# Patient Record
Sex: Female | Born: 1935 | Race: White | Hispanic: No
Health system: Southern US, Community
[De-identification: ages and names within clinical notes are randomized; demographics above are authoritative.]

---

## 2002-11-09 ENCOUNTER — Ambulatory Visit (HOSPITAL_COMMUNITY): Admission: RE | Admit: 2002-11-09 | Discharge: 2002-11-09 | Payer: Self-pay | Admitting: Internal Medicine

## 2002-11-28 ENCOUNTER — Ambulatory Visit (HOSPITAL_COMMUNITY): Admission: RE | Admit: 2002-11-28 | Discharge: 2002-11-28 | Payer: Self-pay | Admitting: Pulmonary Disease

## 2003-04-25 ENCOUNTER — Ambulatory Visit (HOSPITAL_COMMUNITY): Admission: RE | Admit: 2003-04-25 | Discharge: 2003-04-25 | Payer: Self-pay | Admitting: Pulmonary Disease

## 2003-09-19 ENCOUNTER — Ambulatory Visit (HOSPITAL_COMMUNITY): Admission: RE | Admit: 2003-09-19 | Discharge: 2003-09-19 | Payer: Self-pay | Admitting: General Surgery

## 2005-11-05 ENCOUNTER — Ambulatory Visit (HOSPITAL_COMMUNITY): Admission: RE | Admit: 2005-11-05 | Discharge: 2005-11-05 | Payer: Self-pay | Admitting: Pulmonary Disease

## 2005-12-03 ENCOUNTER — Ambulatory Visit (HOSPITAL_COMMUNITY): Admission: RE | Admit: 2005-12-03 | Discharge: 2005-12-03 | Payer: Self-pay | Admitting: Ophthalmology

## 2005-12-31 ENCOUNTER — Ambulatory Visit (HOSPITAL_COMMUNITY): Admission: RE | Admit: 2005-12-31 | Discharge: 2005-12-31 | Payer: Self-pay | Admitting: Ophthalmology

## 2007-04-12 ENCOUNTER — Ambulatory Visit (HOSPITAL_COMMUNITY): Admission: RE | Admit: 2007-04-12 | Discharge: 2007-04-12 | Payer: Self-pay | Admitting: Cardiovascular Disease

## 2009-03-11 ENCOUNTER — Ambulatory Visit (HOSPITAL_COMMUNITY): Admission: RE | Admit: 2009-03-11 | Discharge: 2009-03-11 | Payer: Self-pay | Admitting: Internal Medicine

## 2010-05-25 LAB — CBC
Hemoglobin: 11.4 g/dL — ABNORMAL LOW (ref 12.0–15.0)
Platelets: 270 10*3/uL (ref 150–400)
RDW: 14.1 % (ref 11.5–15.5)

## 2010-05-25 LAB — DIFFERENTIAL
Basophils Absolute: 0 10*3/uL (ref 0.0–0.1)
Lymphocytes Relative: 9 % — ABNORMAL LOW (ref 12–46)
Neutro Abs: 8.2 10*3/uL — ABNORMAL HIGH (ref 1.7–7.7)

## 2010-07-22 NOTE — Procedures (Signed)
NAMEEVA, Theresa Obrien                ACCOUNT NO.:  [id number]   MEDICAL RECORD NO.:  [id number]          PATIENT TYPE:  OUT   LOCATION:  RAD                           FACILITY:  [facility]   PHYSICIAN:  Richard A. Alanda Amass, M.D.DATE OF BIRTH:  [birth year]   DATE OF PROCEDURE:  DATE OF DISCHARGE:                                ECHOCARDIOGRAM   HISTORY OF PRESENT ILLNESS:  This 77 year old white woman has a history  of recent onset memory deficit compatible with OBS under the care of Dr.  Felecia Shelling.  Examination reveals a cardiac murmur compatible with aortic  valvular disease.   The aorta is normal at 3.3 cm.   The aortic root demonstrates mild sclerosis but is normal size.   The aortic valve has three leaflets.  There is moderate aortic  sclerosis.  A fairly good valve opening is seen.  There is a 2.6 meters  per second CW jet across the aortic valve corresponding to a peak  instantaneous gradient of 28 mmHg.  Mean aortic valve gradient is not  calculated, however, it is probably in the 10-15 mmHg range.  Estimated  aortic valve area is approximately 1.5 cm2 compatible with mild aortic  valvular stenosis.  There is trace to mild AI seen.   Left left atrium is enlarged at 4.9 cm.  The patient was in sinus rhythm  during the study and there were no clots seen.   IVS and LV PW were concentrically thickened to 1.5 and 1.4 cm each,  compatible with mild-to-moderate concentric left ventricular  hypertrophy.   There is normal septal and posterior wall motion and no segmental wall  motion abnormality seen.   Left ventricular size is normal with LV IDD equal to 3.8 and LV ISD  equal 2.6 cm.  Left ventricular systolic function is normal with EF  greater than 55%.   LV inflow signal shows E to A reversal compatible with diastolic  relaxation abnormality.   Right ventricle appears normal, tricuspid valve appears normal with mild  TR present.   Mitral valve demonstrates mild mitral annular  calcification.  There is  normal valve opening and.  There is no mitral valve prolapse  demonstrated, and there is mild mitral regurgitation.   Pericardium appears normal and there is no pericardial effusion.   Two-D echo demonstrates mild aortic valvular stenosis with an estimated  peak instantaneous gradient of 20 mmHg and an estimated mean valvular  gradient of approximately 5 mmHg compatible with very mild AS.   There is normal systolic function with mild-to-moderate concentric LVH  and diastolic relaxation abnormality.      Richard A. Alanda Amass, M.D.  Electronically Signed     RAW/MEDQ  D:  04/12/2007  T:  04/13/2007  Job:  [id number]

## 2010-07-25 NOTE — Op Note (Signed)
   [name], Theresa Obrien                            ACCOUNT NO.:  [id number]   MEDICAL RECORD NO.:  [id number]                   PATIENT TYPE:  AMB   LOCATION:  DAY                                  FACILITY:  [facility]   PHYSICIAN:  Lionel December, M.D.                 DATE OF BIRTH:  [birth year]   DATE OF PROCEDURE:  11/09/2002  DATE OF DISCHARGE:                                 OPERATIVE REPORT   ADDENDUM:  We requested records from [facility] and just received them.  The  patient was hospitalized in April 1979 with abdominal pain and rectal  bleeding, and she had a normal sigmoidoscopy and a barium enema showing left  colonic diverticulosis.  She was readmitted in January 1981, and she had a  follow-up barium enema which was normal other than a sigmoidoscopy.  Therefore, she has never had polyps although these are suspected.  The  patient therefore will not need another colonoscopy for 10 years.                                               Lionel December, M.D.    NR/MEDQ  D:  11/09/2002  T:  11/09/2002  Job:  [id number]   cc:   Ramon Dredge L. Juanetta Gosling, M.D.  [address]  [place]  [place] [address]  Fax: [phone]   Richard A. Alanda Amass, M.D.  [phone]  [place]  [place] [address]  Fax: [phone]

## 2010-07-25 NOTE — Procedures (Signed)
   Theresa Obrien, Theresa Obrien                            ACCOUNT NO.:  [id number]   MEDICAL RECORD NO.:  [id number]                   PATIENT TYPE:   LOCATION:                                       FACILITY:   PHYSICIAN:  Edward L. Juanetta Gosling, M.D.             DATE OF BIRTH:   DATE OF PROCEDURE:  DATE OF DISCHARGE:                              PULMONARY FUNCTION TEST   IMPRESSION:  1. Spirometry is normal.  2. Lung volumes show minimal restrictive change.  3. DLCO moderately reduced.      ___________________________________________                                            Oneal Deputy. Juanetta Gosling, M.D.   ELH/MEDQ  D:  11/30/2002  T:  12/01/2002  Job:  [id number]   cc:   Gerlene Burdock A. Alanda Amass, M.D.  [phone] [address]  [place]  [place] [address]  Fax: [phone]

## 2010-07-25 NOTE — Op Note (Signed)
Theresa Obrien, Theresa Obrien                ACCOUNT NO.:  [id number]   MEDICAL RECORD NO.:  [id number]          PATIENT TYPE:  AMB   LOCATION:  DAY                           FACILITY:  [facility]   PHYSICIAN:  Susanne Greenhouse, MD       DATE OF BIRTH:  [birth year]   DATE OF PROCEDURE:  12/03/2005  DATE OF DISCHARGE:                                 OPERATIVE REPORT   PREOPERATIVE DIAGNOSIS:  Nuclear and cortical cataract, right eye.   POSTOPERATIVE DIAGNOSIS:  Nuclear and cortical cataract, right eye.   OPERATION PERFORMED:  Phacoemulsification, posterior chamber, interocular  lens implantation, right eye.   SURGEON:  Susanne Greenhouse, MD   ANESTHESIA:  Topical monitored anesthesia care.   OPERATIVE SUMMARY:  In the preoperative holding area, dilating drops and 2%  viscous lidocaine were placed into the right eye.  Patient was brought to  the operating room, where the eye was prepped and draped.  A super-sharp  blade was used to make a paracentesis port at the surgeon's 2 o'clock  position.  The anterior chamber was filled with 1% nonpreserved lidocaine  solution followed by Amvisc Plus.  A 2.85 mm keratome blade was then used to  make a clear corneal incision at the superotemporal limbus.  A cystotome  needle was used to create a continuous __________ capsulotomy.  Hydrodistention was performed using balanced salt solution on a fine  cannula.  The lens nucleus was removed with phacoemulsification using a  quadrant-cracking technique.  Residual cortex was removed with irrigation  and aspiration.  The capsular bag and anterior chamber were refilled with  Amvisc Plus.  A posterior chamber intraocular lens was placed into the  capsular bag using a Monarch lens injecting system.  The Amvisc Plus was  then removed from the capsular bag and anterior chamber with irrigation and  aspiration.  Stromal hydration of the main incision and paracentesis ports  were performed with balanced salt solution on a fine  cannula.  The wound was  tested for leak, which was negative.  No sutures were placed.  There were no  operative complications.  Patient tolerated the procedure well and was  returned to the recovery area in satisfactory condition.   PROSTHETIC DEVICES:  Alcon AcrySof posterior chamber interocular lens, model  SN60WF, power of 23.5, serial #[id number].           ______________________________  Susanne Greenhouse, MD     KEH/MEDQ  D:  12/03/2005  T:  12/04/2005  Job:  [id number]

## 2010-07-25 NOTE — Op Note (Signed)
NAMESARAI, Theresa Obrien                            ACCOUNT NO.:  [id number]   MEDICAL RECORD NO.:  [id number]                   PATIENT TYPE:  AMB   LOCATION:  DAY                                  FACILITY:  [facility]   PHYSICIAN:  Lionel December, M.D.                 DATE OF BIRTH:  September 16, 1931   DATE OF PROCEDURE:  11/09/2002  DATE OF DISCHARGE:                                 OPERATIVE REPORT   PROCEDURE:  Total colonoscopy.   HISTORY:  Theresa Obrien is a 77 year old Caucasian female who was scheduled for a  screening colonoscopy.  However, she tells me that she had a colonoscopy  about 20 years ago when she had a GI bleed and had some polyps removed.  Family history, however, is negative for colorectal carcinoma, and she  presently does not have any GI symptoms.   PROCEDURE IN DETAIL:  The procedure risks were reviewed with the patient and  informed consent was obtained.   PREOPERATIVE MEDICATIONS:  Demerol 45 mg IV, Versed 6 mg IV in divided dose.   FINDINGS:  The procedure was performed in the endoscopy suite.  The  patient's vital signs and O2 sat were monitored during the procedure and  remained stable.  The patient was placed in the left lateral recumbent  position.  Rectal examination was performed.  No abnormality noted on  external or digital exam.  The Olympus video scope was placed in the rectum  and advanced into the sigmoid colon and beyond.  Preparation was  satisfactory.  Multiple diverticula were noted at the sigmoid, a few more at  descending, and two tiny ones at transverse colon.  The scope was advanced  to the cecum, which was identified by the ileocecal valve and appendiceal  orifice.  The valve was quite __________.  Pictures taken for the record.  As the scope was withdrawn, the colonic mucosa as well as skin carefully  examined.  There was a tiny cecal area which was nonbleeding in the left  lumen.  The mucosa and the rest of the colon was normal.  The rectal mucosa  was also normal.  The scope was retroflexed and examined the anorectal  junction.  Small hemorrhoids were noted below the dentate line.  The  endoscope was straightened and withdrawn.  The patient tolerated the  procedure well.   FINAL DIAGNOSES:  1. Examination performed to the cecum.  2. No evidence of colonic polyps.  3. Diverticulosis, predominantly left-sided.  4. Small cecal area.  5. Small external hemorrhoids.   RECOMMENDATIONS:  1. High-fiber diet.  2.     Citrucel 1 tablespoonful daily.  3. We will try to get records of last colonoscopy from [facility].  If indeed she     has had polyps, then she should consider her next exam in five years from     now.  Otherwise, could wait ten.  Lionel December, M.D.    NR/MEDQ  D:  11/09/2002  T:  11/09/2002  Job:  [id number]   cc:   Ramon Dredge L. Juanetta Gosling, M.D.  [address]  [place]  [place] [address]  Fax: [phone]

## 2011-11-18 ENCOUNTER — Encounter (HOSPITAL_COMMUNITY): Payer: Self-pay

## 2011-11-18 ENCOUNTER — Ambulatory Visit (HOSPITAL_COMMUNITY)
Admission: RE | Admit: 2011-11-18 | Discharge: 2011-11-18 | Disposition: A | Discharge status: Home / Self Care | Payer: PRIVATE HEALTH INSURANCE | Source: Ambulatory Visit | Attending: Internal Medicine | Admitting: Internal Medicine

## 2011-11-18 ENCOUNTER — Other Ambulatory Visit (HOSPITAL_COMMUNITY): Payer: Self-pay | Admitting: Internal Medicine

## 2011-11-18 DIAGNOSIS — S1093XA Contusion of unspecified part of neck, initial encounter: Secondary | ICD-10-CM | POA: Insufficient documentation

## 2011-11-18 DIAGNOSIS — G319 Degenerative disease of nervous system, unspecified: Secondary | ICD-10-CM | POA: Insufficient documentation

## 2011-11-18 DIAGNOSIS — W19XXXA Unspecified fall, initial encounter: Secondary | ICD-10-CM

## 2011-11-18 DIAGNOSIS — R51 Headache: Secondary | ICD-10-CM | POA: Insufficient documentation

## 2011-11-18 DIAGNOSIS — S0003XA Contusion of scalp, initial encounter: Secondary | ICD-10-CM | POA: Insufficient documentation

## 2011-11-18 DIAGNOSIS — W1809XA Striking against other object with subsequent fall, initial encounter: Secondary | ICD-10-CM | POA: Insufficient documentation

## 2013-12-28 IMAGING — CT CT HEAD W/O CM
1 of 3 series · 13 of 30 positions shown, 17 images · non-contrast
Comparison: 03/11/2009

CLINICAL DATA: Fall.  forehead hematoma.

CT HEAD WITHOUT CONTRAST
TECHNIQUE: Contiguous axial images were obtained from the base of
the skull through the vertex without contrast.

[Series 4: headtrauma 2.4 h60s · axial · 0.43mm/px · z∈[+116,+243]mm · 13 of 60 slices shown, 17 images]
[im 5/60  brain]
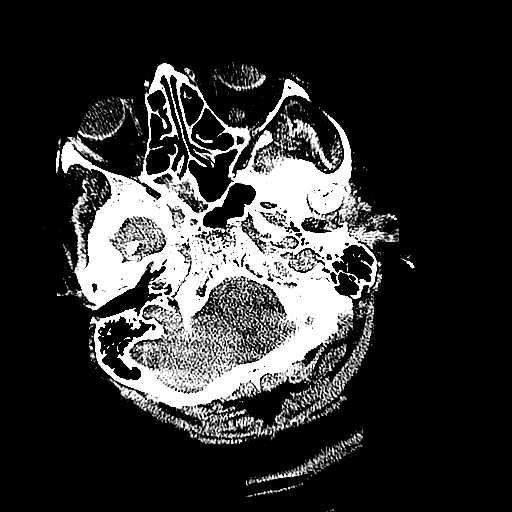
[im 5/60  bone]
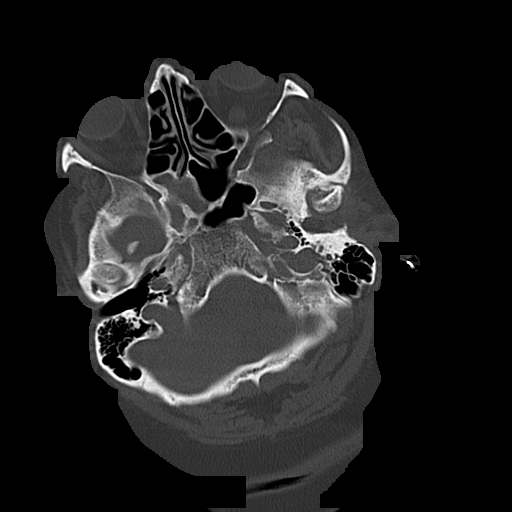
[im 9/60  brain]
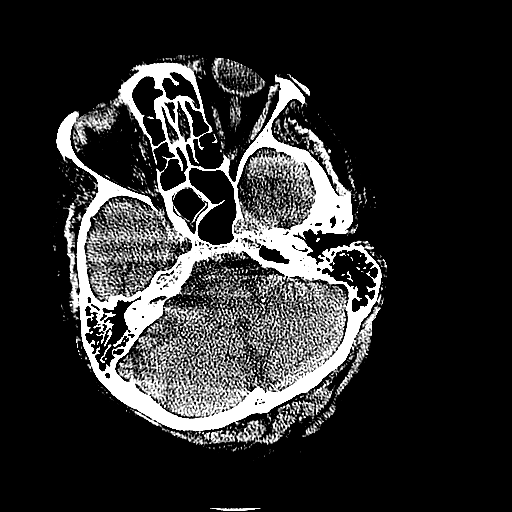
[im 13/60  brain]
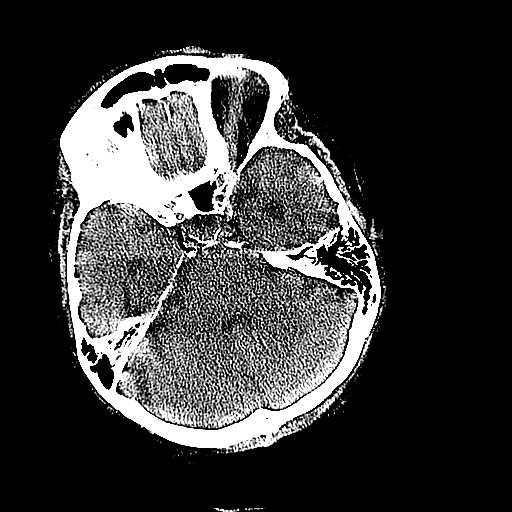
[im 17/60  brain]
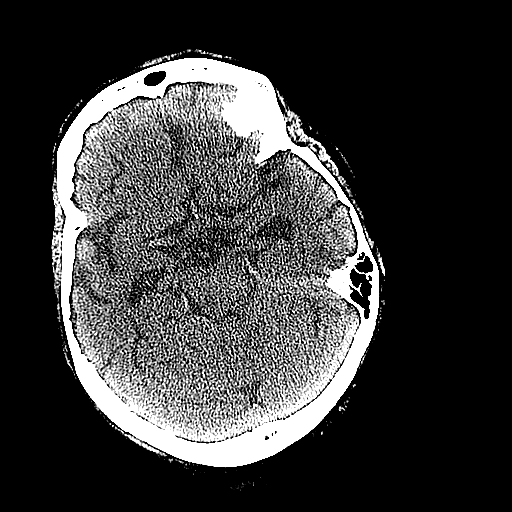
[im 22/60  brain]
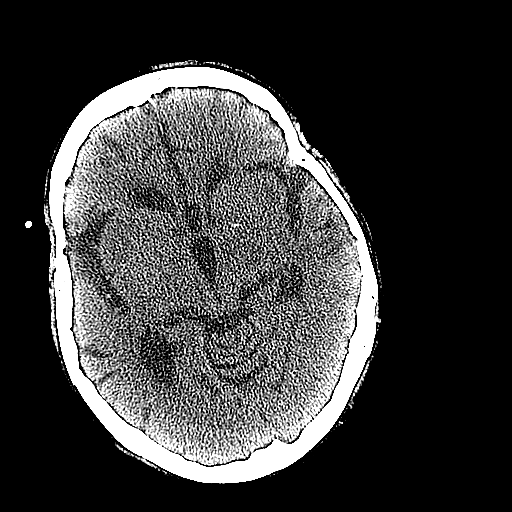
[im 22/60  bone]
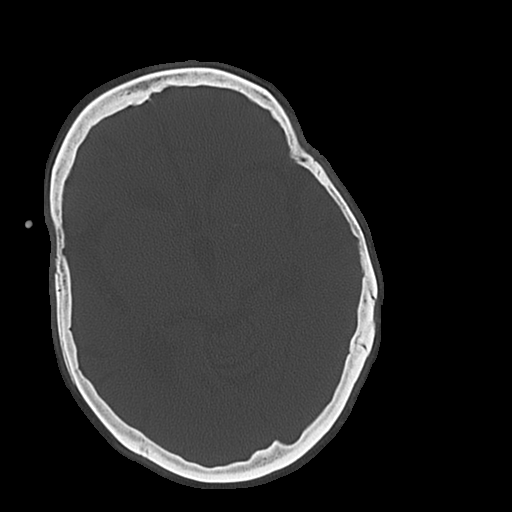
[im 26/60  brain]
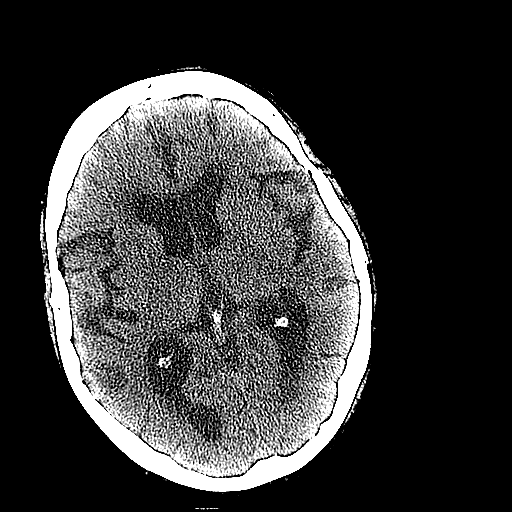
[im 30/60  brain]
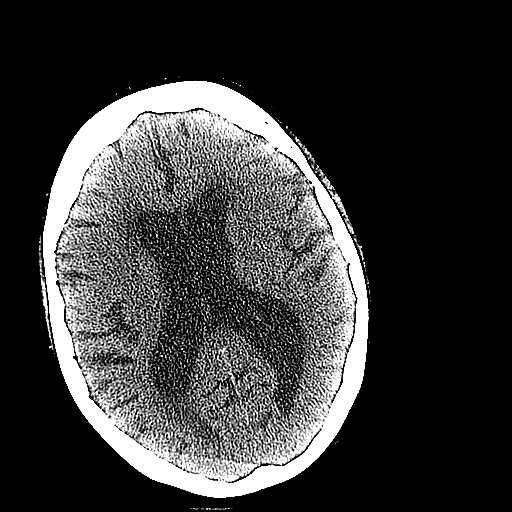
[im 34/60  brain]
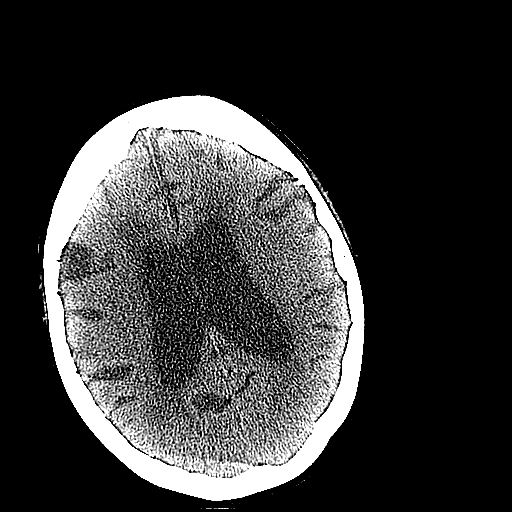
[im 38/60  brain]
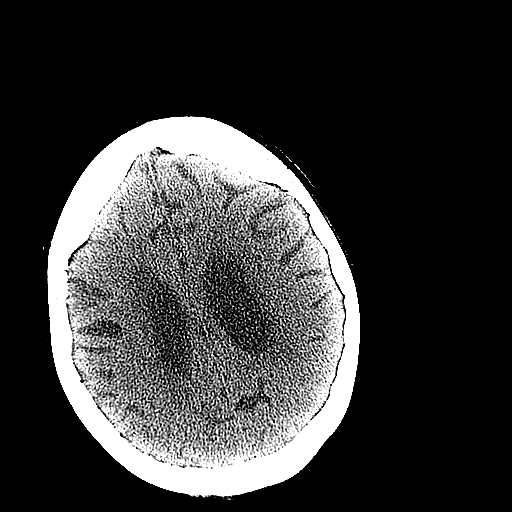
[im 38/60  bone]
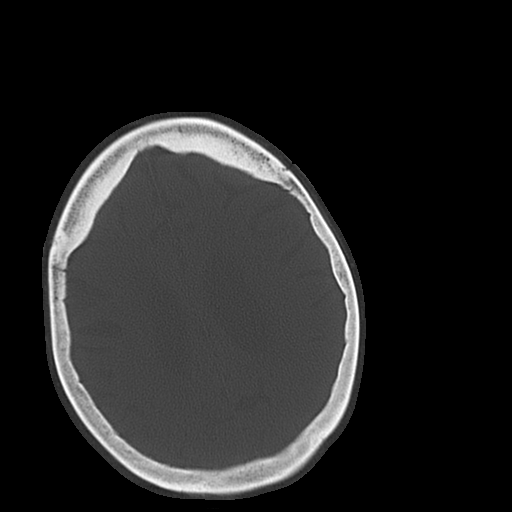
[im 43/60  brain]
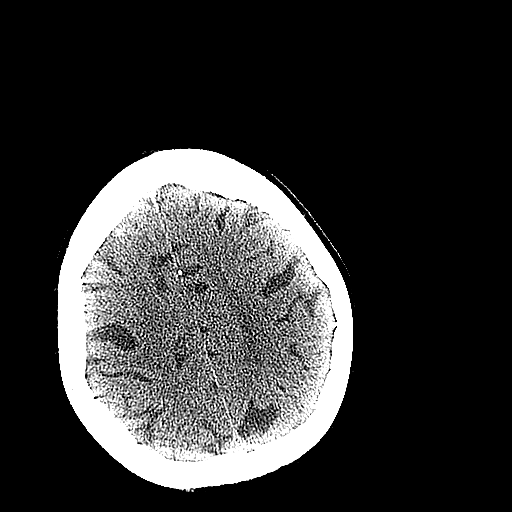
[im 47/60  brain]
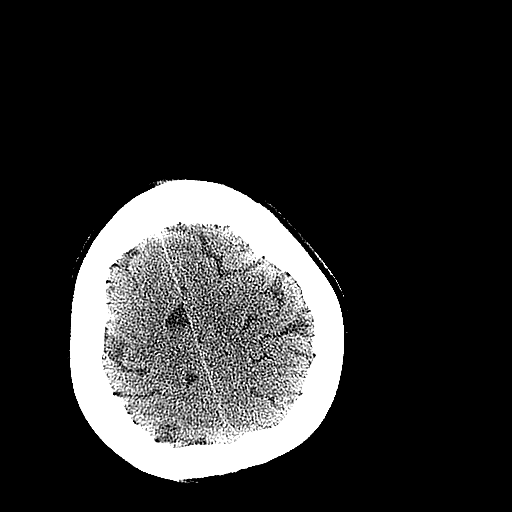
[im 51/60  brain]
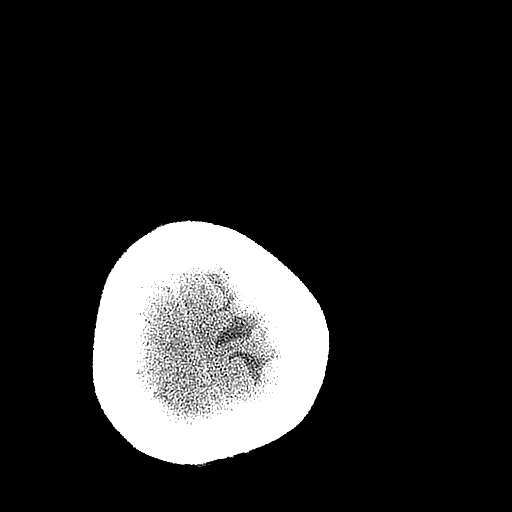
[im 55/60  brain]
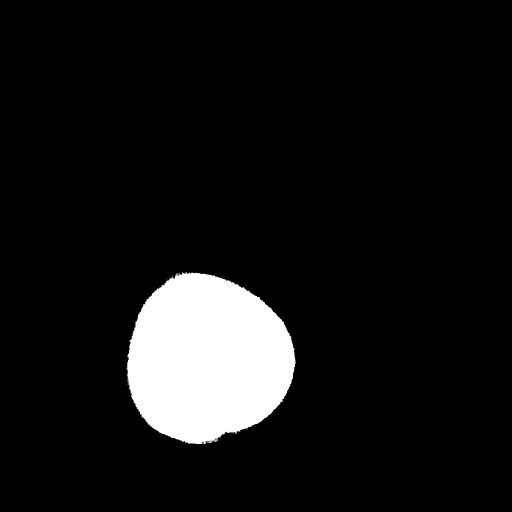
[im 55/60  bone]
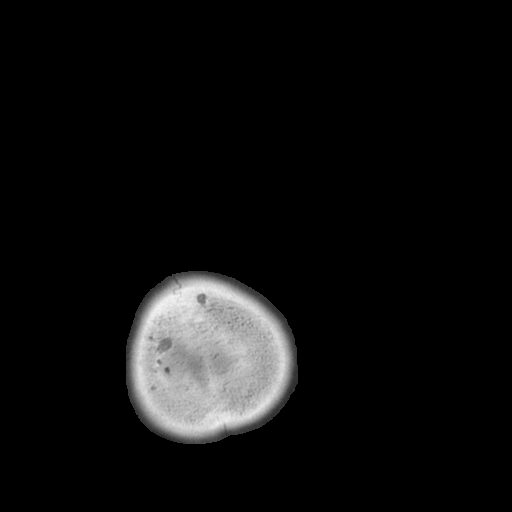

[13 of 30 positions shown; findings below may reference images not displayed]

FINDINGS: Soft tissue swelling over the left forehead.  No
underlying calvarial abnormality. There is atrophy and chronic
small vessel disease changes. No acute intracranial abnormality.
Specifically, no hemorrhage, hydrocephalus, mass lesion, acute
infarction, or significant intracranial injury.  No acute calvarial
abnormality. Visualized paranasal sinuses and mastoids clear.
Orbital soft tissues unremarkable.
IMPRESSION: No acute intracranial abnormality.

Atrophy, chronic microvascular disease.

## 2015-05-17 ENCOUNTER — Other Ambulatory Visit (HOSPITAL_COMMUNITY): Payer: Self-pay | Admitting: Geriatric Medicine

## 2015-05-17 ENCOUNTER — Ambulatory Visit (HOSPITAL_COMMUNITY)
Admission: RE | Admit: 2015-05-17 | Discharge: 2015-05-17 | Disposition: A | Discharge status: Home / Self Care | Payer: Medicare Other | Source: Ambulatory Visit | Attending: Geriatric Medicine | Admitting: Geriatric Medicine

## 2015-05-17 DIAGNOSIS — I5020 Unspecified systolic (congestive) heart failure: Secondary | ICD-10-CM | POA: Diagnosis not present

## 2015-05-17 LAB — ECHOCARDIOGRAM COMPLETE
AORTIC ROOT 2D: 32 mm
AV Mean grad: 11 mmHg
AV Peak grad: 25 mmHg
AV vel: 2.14
Ao pk vel: 0.56 m/s
CHL CUP LVOT MEAN VEL: 91.7 cm/s
DOP CAL AO MEAN VELOCITY: 152 cm/s
E/e' ratio: 7.45
EWDT: 373 ms
FS: 34 % (ref 28–44)
IVS/LV PW RATIO, ED: 0.92
LA VOL 2D: 48.9 mL
LA diam end sys: 46 mm
LA vol: 61.6 mL
LASIZE: 46 mm
LV PW s: 14.9 mm
LV TDI E'MEDIAL: 5.33 cm/s
LVIDD: 36.5 mm — AB (ref 3.5–6.0)
LVIDS: 24 mm — AB (ref 2.1–4.0)
LVOT VTI: 33.8 cm
LVOT area: 3.14 cm2
LVOT peak grad rest: 8 mmHg
LVOTD: 20 mm
LVOTPV: 138 cm/s
LVOTSV: 106 mL
LVOTVTI: 0.68 cm
MV Dec: 373 ms
MV pk A vel: 87.3 cm/s
MVPKEVEL: 68.9 cm/s
PW: 14.9 mm — AB (ref 0.6–1.1)
TAPSE: 23.5 mm
TDI e' lateral: 9.25 cm/s
VTI: 49.7 cm
Valve area: 2.14 cm2

## 2015-05-17 NOTE — Progress Notes (Signed)
*  PRELIMINARY RESULTS* Echocardiogram 2D Echocardiogram has been performed.  Theresa Obrien, Theresa Obrien 05/17/2015, 12:58 PM

## 2015-05-31 ENCOUNTER — Ambulatory Visit: Payer: Medicare Other | Admitting: Cardiology

## 2015-06-08 DEATH — deceased
